# Patient Record
Sex: Male | Born: 2000 | Race: White | Hispanic: No | Marital: Single | Smoking: Never smoker
Health system: Southern US, Community
[De-identification: ages and names within clinical notes are randomized; demographics above are authoritative.]

---

## 2020-09-09 ENCOUNTER — Emergency Department
Admission: EM | Admit: 2020-09-09 | Discharge: 2020-09-09 | Disposition: A | Discharge status: Home / Self Care | Payer: Medicaid Other | Source: Home / Self Care | Attending: Emergency Medicine | Admitting: Emergency Medicine

## 2020-09-09 ENCOUNTER — Other Ambulatory Visit: Payer: Self-pay

## 2020-09-09 ENCOUNTER — Encounter: Payer: Self-pay | Admitting: Emergency Medicine

## 2020-09-09 ENCOUNTER — Emergency Department: Payer: Medicaid Other

## 2020-09-09 DIAGNOSIS — E876 Hypokalemia: Secondary | ICD-10-CM

## 2020-09-09 DIAGNOSIS — F10220 Alcohol dependence with intoxication, uncomplicated: Secondary | ICD-10-CM | POA: Insufficient documentation

## 2020-09-09 DIAGNOSIS — S0181XA Laceration without foreign body of other part of head, initial encounter: Secondary | ICD-10-CM | POA: Insufficient documentation

## 2020-09-09 DIAGNOSIS — L03213 Periorbital cellulitis: Secondary | ICD-10-CM | POA: Diagnosis not present

## 2020-09-09 DIAGNOSIS — F10920 Alcohol use, unspecified with intoxication, uncomplicated: Secondary | ICD-10-CM

## 2020-09-09 LAB — CBC WITH DIFFERENTIAL/PLATELET
Abs Immature Granulocytes: 0.03 10*3/uL (ref 0.00–0.07)
Basophils Absolute: 0.1 10*3/uL (ref 0.0–0.1)
Basophils Relative: 1 %
Eosinophils Absolute: 0.3 10*3/uL (ref 0.0–0.5)
Eosinophils Relative: 3 %
HCT: 43.7 % (ref 39.0–52.0)
Hemoglobin: 15.2 g/dL (ref 13.0–17.0)
Immature Granulocytes: 0 %
Lymphocytes Relative: 25 %
Lymphs Abs: 2.1 10*3/uL (ref 0.7–4.0)
MCH: 29.7 pg (ref 26.0–34.0)
MCHC: 34.8 g/dL (ref 30.0–36.0)
MCV: 85.5 fL (ref 80.0–100.0)
Monocytes Absolute: 0.5 10*3/uL (ref 0.1–1.0)
Monocytes Relative: 6 %
Neutro Abs: 5.7 10*3/uL (ref 1.7–7.7)
Neutrophils Relative %: 65 %
Platelets: 210 10*3/uL (ref 150–400)
RBC: 5.11 MIL/uL (ref 4.22–5.81)
RDW: 13.2 % (ref 11.5–15.5)
WBC: 8.7 10*3/uL (ref 4.0–10.5)
nRBC: 0 % (ref 0.0–0.2)

## 2020-09-09 LAB — COMPREHENSIVE METABOLIC PANEL
ALT: 28 U/L (ref 0–44)
AST: 39 U/L (ref 15–41)
Albumin: 4.7 g/dL (ref 3.5–5.0)
Alkaline Phosphatase: 95 U/L (ref 38–126)
Anion gap: 12 (ref 5–15)
BUN: 13 mg/dL (ref 6–20)
CO2: 23 mmol/L (ref 22–32)
Calcium: 8.7 mg/dL — ABNORMAL LOW (ref 8.9–10.3)
Chloride: 103 mmol/L (ref 98–111)
Creatinine, Ser: 0.67 mg/dL (ref 0.61–1.24)
GFR, Estimated: >60 mL/min (ref >60)
Glucose, Bld: 101 mg/dL — ABNORMAL HIGH (ref 70–99)
Potassium: 3.1 mmol/L — ABNORMAL LOW (ref 3.5–5.1)
Sodium: 138 mmol/L (ref 135–145)
Total Bilirubin: 0.7 mg/dL (ref 0.3–1.2)
Total Protein: 8 g/dL (ref 6.5–8.1)

## 2020-09-09 LAB — ETHANOL: Alcohol, Ethyl (B): 263 mg/dL — ABNORMAL HIGH (ref <10)

## 2020-09-09 MED ORDER — SODIUM CHLORIDE 0.9 % IV BOLUS
1000.0000 mL | Freq: Once | INTRAVENOUS | Status: AC
  Administered 2020-09-09: 1000 mL via INTRAVENOUS

## 2020-09-09 MED ORDER — LIDOCAINE HCL (PF) 1 % IJ SOLN
5.0000 mL | Freq: Once | INTRAMUSCULAR | Status: AC
  Administered 2020-09-09: 5 mL

## 2020-09-09 MED ORDER — POTASSIUM CHLORIDE CRYS ER 20 MEQ PO TBCR
40.0000 meq | EXTENDED_RELEASE_TABLET | Freq: Once | ORAL | Status: AC
  Administered 2020-09-09: 40 meq via ORAL
  Filled 2020-09-09: qty 2
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
## 2020-09-09 NOTE — Discharge Instructions (Signed)
1.  Suture removal in 5 days. 2.  Drink plenty of fluids daily. 3.  Do not drink alcohol as you are under the legal age limit. 4.  Return to the ER for worsening symptoms, persistent vomiting, lethargy or other concerns.

## 2020-09-09 NOTE — ED Notes (Signed)
[facility] called back at this time and stated that they will not be able to pick him up at this time. States that they also tried calling [employer] without any success at this time.

## 2020-09-09 NOTE — ED Notes (Signed)
Pt to DG att 
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
## 2020-09-09 NOTE — ED Notes (Signed)
Assigned to sit with patient so he will not fall out of bed.AS
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
## 2020-09-09 NOTE — ED Notes (Signed)
Pt denies resources in local area or having a cell phone  Will call taxi for [employer]

## 2020-09-10 ENCOUNTER — Inpatient Hospital Stay
Admission: EM | Admit: 2020-09-10 | Discharge: 2020-09-13 | DRG: 603 | Disposition: A | Discharge status: Home / Self Care | Payer: Medicaid Other | Attending: Internal Medicine | Admitting: Internal Medicine

## 2020-09-10 ENCOUNTER — Other Ambulatory Visit: Payer: Self-pay

## 2020-09-10 ENCOUNTER — Encounter: Payer: Self-pay | Admitting: Emergency Medicine

## 2020-09-10 ENCOUNTER — Emergency Department: Payer: Medicaid Other

## 2020-09-10 DIAGNOSIS — Y92169 Unspecified place in school dormitory as the place of occurrence of the external cause: Secondary | ICD-10-CM | POA: Diagnosis not present

## 2020-09-10 DIAGNOSIS — L03211 Cellulitis of face: Secondary | ICD-10-CM | POA: Diagnosis present

## 2020-09-10 DIAGNOSIS — L03213 Periorbital cellulitis: Secondary | ICD-10-CM | POA: Diagnosis present

## 2020-09-10 DIAGNOSIS — E876 Hypokalemia: Secondary | ICD-10-CM | POA: Diagnosis present

## 2020-09-10 DIAGNOSIS — S01112A Laceration without foreign body of left eyelid and periocular area, initial encounter: Secondary | ICD-10-CM | POA: Diagnosis present

## 2020-09-10 DIAGNOSIS — H02846 Edema of left eye, unspecified eyelid: Secondary | ICD-10-CM | POA: Diagnosis not present

## 2020-09-10 DIAGNOSIS — Z20822 Contact with and (suspected) exposure to covid-19: Secondary | ICD-10-CM | POA: Diagnosis present

## 2020-09-10 DIAGNOSIS — W03XXXA Other fall on same level due to collision with another person, initial encounter: Secondary | ICD-10-CM | POA: Diagnosis present

## 2020-09-10 LAB — CBC WITH DIFFERENTIAL/PLATELET
Abs Immature Granulocytes: 0.05 10*3/uL (ref 0.00–0.07)
Basophils Absolute: 0.1 10*3/uL (ref 0.0–0.1)
Basophils Relative: 0 %
Eosinophils Absolute: 0.1 10*3/uL (ref 0.0–0.5)
Eosinophils Relative: 1 %
HCT: 46.9 % (ref 39.0–52.0)
Hemoglobin: 16 g/dL (ref 13.0–17.0)
Immature Granulocytes: 0 %
Lymphocytes Relative: 7 %
Lymphs Abs: 1 10*3/uL (ref 0.7–4.0)
MCH: 29.4 pg (ref 26.0–34.0)
MCHC: 34.1 g/dL (ref 30.0–36.0)
MCV: 86.2 fL (ref 80.0–100.0)
Monocytes Absolute: 1.5 10*3/uL — ABNORMAL HIGH (ref 0.1–1.0)
Monocytes Relative: 10 %
Neutro Abs: 12.2 10*3/uL — ABNORMAL HIGH (ref 1.7–7.7)
Neutrophils Relative %: 82 %
Platelets: 217 10*3/uL (ref 150–400)
RBC: 5.44 MIL/uL (ref 4.22–5.81)
RDW: 13.2 % (ref 11.5–15.5)
WBC: 14.9 10*3/uL — ABNORMAL HIGH (ref 4.0–10.5)
nRBC: 0 % (ref 0.0–0.2)

## 2020-09-10 LAB — COMPREHENSIVE METABOLIC PANEL
ALT: 26 U/L (ref 0–44)
AST: 28 U/L (ref 15–41)
Albumin: 4.9 g/dL (ref 3.5–5.0)
Alkaline Phosphatase: 102 U/L (ref 38–126)
Anion gap: 14 (ref 5–15)
BUN: 11 mg/dL (ref 6–20)
CO2: 26 mmol/L (ref 22–32)
Calcium: 9.8 mg/dL (ref 8.9–10.3)
Chloride: 96 mmol/L — ABNORMAL LOW (ref 98–111)
Creatinine, Ser: 1.06 mg/dL (ref 0.61–1.24)
GFR, Estimated: >60 mL/min (ref >60)
Glucose, Bld: 102 mg/dL — ABNORMAL HIGH (ref 70–99)
Potassium: 4.2 mmol/L (ref 3.5–5.1)
Sodium: 136 mmol/L (ref 135–145)
Total Bilirubin: 2.1 mg/dL — ABNORMAL HIGH (ref 0.3–1.2)
Total Protein: 8.5 g/dL — ABNORMAL HIGH (ref 6.5–8.1)

## 2020-09-10 LAB — RESPIRATORY PANEL BY RT PCR (FLU A&B, COVID)

## 2020-09-10 MED ORDER — CLINDAMYCIN PHOSPHATE 600 MG/50ML IV SOLN
600.0000 mg | Freq: Three times a day (TID) | INTRAVENOUS | Status: DC
  Administered 2020-09-11 – 2020-09-12 (×4): 600 mg via INTRAVENOUS
  Filled 2020-09-10 (×7): qty 50

## 2020-09-10 MED ORDER — CLINDAMYCIN PHOSPHATE 600 MG/50ML IV SOLN
600.0000 mg | Freq: Once | INTRAVENOUS | Status: AC
  Administered 2020-09-10: 600 mg via INTRAVENOUS
  Filled 2020-09-10: qty 50

## 2020-09-10 MED ORDER — IOHEXOL 300 MG/ML  SOLN
75.0000 mL | Freq: Once | INTRAMUSCULAR | Status: AC | PRN
  Administered 2020-09-10: 75 mL via INTRAVENOUS

## 2020-09-10 MED ORDER — ENOXAPARIN SODIUM 40 MG/0.4ML ~~LOC~~ SOLN
40.0000 mg | SUBCUTANEOUS | Status: DC
  Filled 2020-09-10: qty 0.4

## 2020-09-10 NOTE — ED Notes (Signed)
Father called, RN updated him on plan of care
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
## 2020-09-10 NOTE — ED Notes (Signed)
Pt given snack at this time  
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
## 2020-09-10 NOTE — ED Notes (Signed)
Pt oriented to BR, call light, and given two warm blankets. No further needs expressed at this time.

## 2020-09-10 NOTE — ED Notes (Signed)
Per Lab, covid sample that was sent was not valid. Needs to be redone. RN notified.
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
## 2020-09-11 DIAGNOSIS — L03213 Periorbital cellulitis: Secondary | ICD-10-CM | POA: Diagnosis not present

## 2020-09-11 LAB — RESPIRATORY PANEL BY RT PCR (FLU A&B, COVID)
Influenza A by PCR: NEGATIVE
Influenza B by PCR: NEGATIVE
SARS Coronavirus 2 by RT PCR: NEGATIVE

## 2020-09-11 LAB — BASIC METABOLIC PANEL
Anion gap: 9 (ref 5–15)
BUN: 9 mg/dL (ref 6–20)
CO2: 28 mmol/L (ref 22–32)
Calcium: 9.1 mg/dL (ref 8.9–10.3)
Chloride: 98 mmol/L (ref 98–111)
Creatinine, Ser: 0.88 mg/dL (ref 0.61–1.24)
GFR, Estimated: >60 mL/min (ref >60)
Glucose, Bld: 110 mg/dL — ABNORMAL HIGH (ref 70–99)
Potassium: 3.6 mmol/L (ref 3.5–5.1)
Sodium: 135 mmol/L (ref 135–145)

## 2020-09-11 LAB — CBC
HCT: 42 % (ref 39.0–52.0)
Hemoglobin: 14.5 g/dL (ref 13.0–17.0)
MCH: 29.6 pg (ref 26.0–34.0)
MCHC: 34.5 g/dL (ref 30.0–36.0)
MCV: 85.7 fL (ref 80.0–100.0)
Platelets: 183 10*3/uL (ref 150–400)
RBC: 4.9 MIL/uL (ref 4.22–5.81)
RDW: 13.2 % (ref 11.5–15.5)
WBC: 12.5 10*3/uL — ABNORMAL HIGH (ref 4.0–10.5)
nRBC: 0 % (ref 0.0–0.2)

## 2020-09-11 LAB — HIV ANTIBODY (ROUTINE TESTING W REFLEX): HIV Screen 4th Generation wRfx: NONREACTIVE

## 2020-09-11 MED ORDER — ERYTHROMYCIN 5 MG/GM OP OINT
TOPICAL_OINTMENT | Freq: Three times a day (TID) | OPHTHALMIC | Status: DC
  Administered 2020-09-11 – 2020-09-13 (×5): 1 via OPHTHALMIC
  Filled 2020-09-11 (×2): qty 1

## 2020-09-11 MED ORDER — IBUPROFEN 400 MG PO TABS
400.0000 mg | ORAL_TABLET | Freq: Four times a day (QID) | ORAL | Status: DC | PRN
  Administered 2020-09-11 – 2020-09-12 (×3): 400 mg via ORAL
  Filled 2020-09-11 (×3): qty 1

## 2020-09-11 NOTE — ED Notes (Signed)
Pt resting at this time, swelling noted to left side of face. RR even and unlabored

## 2020-09-11 NOTE — ED Notes (Signed)
Pt eating breakfast at this time. Call bell within reach, stretcher locked in lowest position, denies needs
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
## 2020-09-11 NOTE — ED Notes (Signed)
Ophthalmology MD at bedside
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
## 2020-09-11 NOTE — ED Notes (Signed)
Pt provided phone to contact father

## 2020-09-11 NOTE — ED Notes (Signed)
Attempted to call report to [facility], notified that nurse has not yet been assigned

## 2020-09-11 NOTE — ED Notes (Signed)
Report given to receive RN for RM 212. Pt up ad lib in room. AO x4. Call bell within reach. Being transported to inpatient room via wheelchair by ED tech.

## 2020-09-12 DIAGNOSIS — L03213 Periorbital cellulitis: Secondary | ICD-10-CM | POA: Diagnosis not present

## 2020-09-12 MED ORDER — CLINDAMYCIN HCL 150 MG PO CAPS
600.0000 mg | ORAL_CAPSULE | Freq: Three times a day (TID) | ORAL | Status: DC
  Administered 2020-09-12 – 2020-09-13 (×3): 600 mg via ORAL
  Filled 2020-09-12 (×5): qty 4

## 2020-09-12 NOTE — Progress Notes (Signed)
Updated patient's parent via phone. Per patient's father, patient begins fall break tomorrow and has a flight that departs [facility] at 2030. Patient's shuttle from school is transporting him to [facility] at 1600. Relayed to [employer]. Will also leave not for MD.   Madie Reno, RN

## 2020-09-12 NOTE — Plan of Care (Signed)
Patient managing pain well without PRN pain medications. Patient given warm washcloth to help with eye drainage. Ice pack provided for patient to for left cheek edema.   Madie Reno, RN
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
## 2020-09-13 DIAGNOSIS — H02846 Edema of left eye, unspecified eyelid: Secondary | ICD-10-CM

## 2020-09-13 DIAGNOSIS — L03213 Periorbital cellulitis: Secondary | ICD-10-CM | POA: Diagnosis not present

## 2020-09-13 MED ORDER — CLINDAMYCIN HCL 300 MG PO CAPS: 300.0000 mg | ORAL_CAPSULE | Freq: Three times a day (TID) | ORAL | 0 refills | Status: AC

## 2020-09-13 MED ORDER — ERYTHROMYCIN 5 MG/GM OP OINT: TOPICAL_OINTMENT | Freq: Three times a day (TID) | OPHTHALMIC | 0 refills | Status: AC

## 2020-09-13 MED ORDER — RISAQUAD PO CAPS
2.0000 | ORAL_CAPSULE | Freq: Every day | ORAL | Status: DC
  Administered 2020-09-13: 2 via ORAL
  Filled 2020-09-13: qty 2

## 2020-09-13 MED ORDER — RISAQUAD PO CAPS: ORAL_CAPSULE | ORAL | 0 refills | Status: AC

## 2020-09-13 MED ORDER — METHYLPREDNISOLONE SODIUM SUCC 40 MG IJ SOLR
40.0000 mg | Freq: Once | INTRAMUSCULAR | Status: AC
  Administered 2020-09-13: 40 mg via INTRAVENOUS
  Filled 2020-09-13: qty 1

## 2020-09-13 MED ORDER — PREDNISONE 10 MG PO TABS: ORAL_TABLET | ORAL | 0 refills | Status: AC

## 2020-09-13 NOTE — Discharge Summary (Signed)
[facility] [place] at [facility]   PATIENT NAME: Cory Long    MR#:  [id number]  DATE OF BIRTH:  [birth year]  DATE OF ADMISSION:  09/10/2020 ADMITTING PHYSICIAN: Anselm Jungling, DO  DATE OF DISCHARGE: 09/13/2020 10:04 AM  PRIMARY CARE PHYSICIAN: Patient, No Pcp Per    ADMISSION DIAGNOSIS:  Preseptal cellulitis of left eye [L03.213]  DISCHARGE DIAGNOSIS:  Principal Problem:   Preseptal cellulitis of left eye   SECONDARY DIAGNOSIS:  History reviewed. No pertinent past medical history.  HOSPITAL COURSE:   1.  Preseptal cellulitis of the left eye.  Upper eyelid is very swollen.  Redness has improved a little bit.  Patient was on IV clindamycin.  Seen by ophthalmology.  The patient is going on break from school and will be going home.  Patient switched to oral clindamycin yesterday.  I will give IV Solu-Medrol 1 time now and prednisone taper upon disposition.  We will also give a probiotic.  Spoke with the patient's father on the phone and he will have the patient follow-up with a physician from his hometown.  DISCHARGE CONDITIONS:   Satisfactory  CONSULTS OBTAINED:  ENT  DRUG ALLERGIES:  No Known Allergies  DISCHARGE MEDICATIONS:   Allergies as of 09/13/2020   No Known Allergies     Medication List    TAKE these medications   acidophilus Caps capsule 2 capsules daily for one month (can substitute any probiotic)   clindamycin 300 MG capsule Commonly known as: CLEOCIN Take 1 capsule (300 mg total) by mouth every 8 (eight) hours.   erythromycin ophthalmic ointment Place into the left eye every 8 (eight) hours.   ibuprofen 200 MG tablet Commonly known as: ADVIL Take 400 mg by mouth every 6 (six) hours as needed.   predniSONE 10 MG tablet Commonly known as: DELTASONE 3 tabs po day1; 2 tabs po day2,3; 1 tab po day4,5; 1/2 tab po day6,7        DISCHARGE INSTRUCTIONS:   Follow-up with your medical doctor 1 week  If you experience  worsening of your admission symptoms, develop shortness of breath, life threatening emergency, suicidal or homicidal thoughts you must seek medical attention immediately by calling 911 or calling your MD immediately  if symptoms less severe.  You Must read complete instructions/literature along with all the possible adverse reactions/side effects for all the Medicines you take and that have been prescribed to you. Take any new Medicines after you have completely understood and accept all the possible adverse reactions/side effects.   Please note  You were cared for by a hospitalist during your hospital stay. If you have any questions about your discharge medications or the care you received while you were in the hospital after you are discharged, you can call the unit and asked to speak with the hospitalist on call if the hospitalist that took care of you is not available. Once you are discharged, your primary care physician will handle any further medical issues. Please note that NO REFILLS for any discharge medications will be authorized once you are discharged, as it is imperative that you return to your primary care physician (or establish a relationship with a primary care physician if you do not have one) for your aftercare needs so that they can reassess your need for medications and monitor your lab values.    Today   CHIEF COMPLAINT:   Chief Complaint  Patient presents with  . Eye Pain    HISTORY OF PRESENT  ILLNESS:  Cory Long  is a 19 y.o. male came in 2 days after having a fall and having a cut over his left eyelid.  Left eyelid very swollen and erythematous.   VITAL SIGNS:  Blood pressure 118/68, pulse (!) 51, temperature 97.6 F (36.4 C), temperature source Oral, resp. rate 20, height 6\' 1"  (1.854 m), weight 83.9 kg, SpO2 99 %.  I/O:    Intake/Output Summary (Last 24 hours) at 09/13/2020 1724 Last data filed at 09/13/2020 1004 Gross per 24 hour  Intake 720 ml   Output --  Net 720 ml    PHYSICAL EXAMINATION:  GENERAL:  19 y.o.-year-old patient lying in the bed with no acute distress.  EYES: Pupils equal, round, reactive to light and accommodation. No scleral icterus or erythema.  Left upper eyelid very swollen. HEENT: Head atraumatic, normocephalic. Oropharynx and nasopharynx clear.   LUNGS: Normal breath sounds bilaterally, no wheezing, rales,rhonchi or crepitation.  CARDIOVASCULAR: S1, S2 normal. No murmurs, rubs, or gallops.  ABDOMEN: Soft, non-tender. EXTREMITIES: No pedal edema.  NEUROLOGIC: Cranial nerves II through XII are intact. Muscle strength 5/5 in all extremities. Sensation intact. Gait not checked.  PSYCHIATRIC: The patient is alert and oriented x 3.  SKIN: Left eyelid swollen and erythematous  DATA REVIEW:   CBC Recent Labs  Lab 09/11/20 0418  WBC 12.5*  HGB 14.5  HCT 42.0  PLT 183    Chemistries  Recent Labs  Lab 09/10/20 1643 09/10/20 1643 09/11/20 0418  NA 136   < > 135  K 4.2   < > 3.6  CL 96*   < > 98  CO2 26   < > 28  GLUCOSE 102*   < > 110*  BUN 11   < > 9  CREATININE 1.06   < > 0.88  CALCIUM 9.8   < > 9.1  AST 28  --   --   ALT 26  --   --   ALKPHOS 102  --   --   BILITOT 2.1*  --   --    < > = values in this interval not displayed.     Microbiology Results  Results for orders placed or performed during the hospital encounter of 09/10/20  Respiratory Panel by RT PCR (Flu A&B, Covid) - Nasopharyngeal Swab     Status: Abnormal   Collection Time: 09/10/20  8:15 PM   Specimen: Nasopharyngeal Swab  Result Value Ref Range Status   SARS Coronavirus 2 by RT PCR (A) NEGATIVE Final    INVALID, UNABLE TO DETERMINE THE PRESENCE OF TARGET DUE TO SPECIMEN INTEGRITY. RECOLLECTION REQUESTED.    Comment: [name], [name] AT 2208 ON 09/10/20 BY SAINVILUS S   Influenza A by PCR (A) NEGATIVE Final    INVALID, UNABLE TO DETERMINE THE PRESENCE OF TARGET DUE TO SPECIMEN INTEGRITY. RECOLLECTION REQUESTED.    Influenza B by PCR (A) NEGATIVE Final    INVALID, UNABLE TO DETERMINE THE PRESENCE OF TARGET DUE TO SPECIMEN INTEGRITY. RECOLLECTION REQUESTED.    Comment: Performed at [facility], [address] [place], [place]  Respiratory Panel by RT PCR (Flu A&B, Covid) - Nasopharyngeal Swab     Status: None   Collection Time: 09/11/20  1:09 AM   Specimen: Nasopharyngeal Swab  Result Value Ref Range Status   SARS Coronavirus 2 by RT PCR NEGATIVE NEGATIVE Final    Comment: (NOTE) SARS-CoV-2 target nucleic acids are NOT DETECTED.  The SARS-CoV-2 RNA is generally detectable in  upper respiratoy specimens during the acute phase of infection. The lowest concentration of SARS-CoV-2 viral copies this assay can detect is 131 copies/mL. A negative result does not preclude SARS-Cov-2 infection and should not be used as the sole basis for treatment or other patient management decisions. A negative result may occur with  improper specimen collection/handling, submission of specimen other than nasopharyngeal swab, presence of viral mutation(s) within the areas targeted by this assay, and inadequate number of viral copies (<131 copies/mL). A negative result must be combined with clinical observations, patient history, and epidemiological information. The expected result is Negative.  Fact Sheet for Patients:  https://www.moore.com/  Fact Sheet for Healthcare Providers:  https://www.young.biz/  This test is no t yet approved or cleared by the [place] FDA and  has been authorized for detection and/or diagnosis of SARS-CoV-2 by FDA under an Emergency Use Authorization (EUA). This EUA will remain  in effect (meaning this test can be used) for the duration of the COVID-19 declaration under Section 564(b)(1) of the Act, 21 U.S.C. section 360bbb-3(b)(1), unless the authorization is terminated or revoked sooner.     Influenza A by PCR NEGATIVE  NEGATIVE Final   Influenza B by PCR NEGATIVE NEGATIVE Final    Comment: (NOTE) The Xpert Xpress SARS-CoV-2/FLU/RSV assay is intended as an aid in  the diagnosis of influenza from Nasopharyngeal swab specimens and  should not be used as a sole basis for treatment. Nasal washings and  aspirates are unacceptable for Xpert Xpress SARS-CoV-2/FLU/RSV  testing.  Fact Sheet for Patients: https://www.moore.com/  Fact Sheet for Healthcare Providers: https://www.young.biz/  This test is not yet approved or cleared by the [place] FDA and  has been authorized for detection and/or diagnosis of SARS-CoV-2 by  FDA under an Emergency Use Authorization (EUA). This EUA will remain  in effect (meaning this test can be used) for the duration of the  Covid-19 declaration under Section 564(b)(1) of the Act, 21  U.S.C. section 360bbb-3(b)(1), unless the authorization is  terminated or revoked. Performed at [facility], [address] [place], [place] [address]      Management plans discussed with the patient, family and they are in agreement.  CODE STATUS:  Code Status History    Date Active Date Inactive Code Status Order ID Comments User Context   09/10/2020 2204 09/13/2020 1509 Full Code [id number]  Anselm Jungling, DO ED   Advance Care Planning Activity    Questions for Most Recent Historical Code Status (Order [id number])       TOTAL TIME TAKING CARE OF THIS PATIENT: 35 minutes.    Alford Highland M.D on 09/13/2020 at 5:24 PM  Between 7am to 6pm - Pager - [phone]  After 6pm go to [email or web address] - password [email or web address]  [facility]  CC: Primary care physician; Patient, No Pcp Per

## 2020-09-13 NOTE — Discharge Instructions (Signed)
Orbital Cellulitis  Orbital cellulitis is an infection in the eye socket (orbit) and the tissues that surround the eye. The infection can spread to the eyelids, eyebrow area, and cheek. It can also cause a pocket of pus to develop around the eye (orbital abscess). In severe cases, the infection can spread to the brain. Orbital cellulitis is a medical emergency. What are the causes? The most common cause of this condition is a bacterial infection. The infection usually spreads to the eye socket from another part of the body. The infection may start in one of these places:  Nose or sinuses.  Eyelids.  Facial skin.  Bloodstream. What increases the risk? You are more likely to develop this condition if you recently had one of the following:  Upper respiratory infection. This affects the nose, throat, and upper air passages.  Sinus infection.  Eyelid or facial infection.  Tooth infection.  Eye injury or an object on the surface of the eye or in the eyeball that should not be there (foreign body).  Infection that affects the entire body or the bloodstream (systemic infection). What are the signs or symptoms? Symptoms of this condition usually start quickly. Symptoms may include:  Eye pain that gets worse with eye movement.  Swelling around the eye.  Eye redness.  Bulging of the eye.  Inability to move the eye.  Double vision or decreased vision.  Fever. How is this diagnosed? This condition may be diagnosed based on your symptoms and an eye exam. You may also have tests to confirm the diagnosis and to check for an orbital abscess. Other tests (cultures) may be done to find out which specific bacteria are causing the infection. Tests may include:  Complete blood count (CBC).  Blood culture.  Nose, sinus, or throat culture.  Imaging studies, such as a CT scan or MRI. How is this treated? This condition is usually treated in a hospital. Antibiotic medicines are given  directly into a vein through an IV.  At first, you may get IV antibiotics to kill bacteria that often cause orbital cellulitis (broad spectrum antibiotics).  Your medicine may be changed if the culture test results suggest that another antibiotic would be better.  If the IV antibiotics are working to treat your infection, you may be switched to oral antibiotics and allowed to go home.  In some cases, surgery may be needed to drain an orbital abscess. Follow these instructions at home:  Take over-the-counter and prescription medicines only as told by your health care provider.  Take your antibiotic medicine as told by your health care provider. Do not stop taking the antibiotic even if you start to feel better.  Return to your normal activities as told by your health care provider. Ask your health care provider what activities are safe for you.  Keep all follow-up visits as told by your health care provider. This is important. Contact a health care provider if:  You have questions about your medicines.  Your pain is not well-controlled. Get help right away if:  Your eye pain or swelling returns or it gets worse.  You have any changes in your vision.  You develop a fever.  You have vomiting.  You develop a severe headache or numbness in your face. Summary  Orbital cellulitis is an infection in the eye socket (orbit) and the tissues that surround the eye. The infection can spread to other areas.  Symptoms usually start quickly. Some of the symptoms include eye pain that  gets worse with movement, redness and swelling around the eye, and double or decreased vision.  Orbital cellulitis is a medical emergency, and it requires IV antibiotics for treatment.  See your health care provider right away if your symptoms return or get worse. This information is not intended to replace advice given to you by your health care provider. Make sure you discuss any questions you have with your  health care provider. Document Revised: 10/31/2017 Document Reviewed: 08/14/2017 Elsevier Patient Education  2020 [employer].
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
